# Patient Record
Sex: Male | Born: 1965 | Hispanic: No | Marital: Single | Smoking: Current every day smoker
Health system: Southern US, Community
[De-identification: ages and names within clinical notes are randomized; demographics above are authoritative.]

---

## 2017-03-21 ENCOUNTER — Encounter: Payer: Self-pay | Admitting: *Deleted

## 2017-03-21 ENCOUNTER — Emergency Department
Admission: EM | Admit: 2017-03-21 | Discharge: 2017-03-21 | Disposition: A | Discharge status: Home / Self Care | Payer: Self-pay | Attending: Emergency Medicine | Admitting: Emergency Medicine

## 2017-03-21 DIAGNOSIS — M7061 Trochanteric bursitis, right hip: Secondary | ICD-10-CM | POA: Insufficient documentation

## 2017-03-21 DIAGNOSIS — F17210 Nicotine dependence, cigarettes, uncomplicated: Secondary | ICD-10-CM | POA: Insufficient documentation

## 2017-03-21 DIAGNOSIS — Y9301 Activity, walking, marching and hiking: Secondary | ICD-10-CM | POA: Insufficient documentation

## 2017-03-21 MED ORDER — PREDNISONE 10 MG (21) PO TBPK: ORAL_TABLET | ORAL | 0 refills | Status: AC

## 2017-03-21 MED ORDER — DEXAMETHASONE SODIUM PHOSPHATE 10 MG/ML IJ SOLN
10.0000 mg | Freq: Once | INTRAMUSCULAR | Status: AC
  Administered 2017-03-21: 10 mg via INTRAMUSCULAR
  Filled 2017-03-21: qty 1

## 2017-03-21 NOTE — ED Notes (Signed)
See triage note  States he developed pain to right hip area that radiates down anterior aspect of right leg  Ambulates well   No injury

## 2017-03-21 NOTE — ED Triage Notes (Signed)
  Pt arrived to ED reporting right hip and leg pain that began all of a sudden 3 days ago. Pt denies injury, denies numbness and denies swelling. No discoloration. Pt able to ambulate with increased pain.

## 2017-03-21 NOTE — ED Provider Notes (Signed)
[facility] Emergency Department Provider Note   ____________________________________________   I have reviewed the triage vital signs and the nursing notes.   HISTORY  Chief Complaint Leg Pain    HPI Jeremy Morse is a 51 y.o. male presents with right lateral hip pain that developed suddenly 3 days ago without associated injury. Patient reports pain at rest and increases when he lies on his right side, when walking and outpatient along the greater trochanter area. Patient denies any injury or trauma to the right lower extremity in the past. Patient denies any right groin pain, lumbar back pain, symptoms consistent with lumbar radiculopathy or symptoms consistent with strain changes or leg giving way. Patient denies fever, chills, headache, vision changes, chest pain, chest tightness, shortness of breath, abdominal pain, nausea and vomiting.  History reviewed. No pertinent past medical history.  There are no active problems to display for this patient.   History reviewed. No pertinent surgical history.  Prior to Admission medications   Medication Sig Start Date End Date Taking? Authorizing Provider  predniSONE (STERAPRED UNI-PAK 21 TAB) 10 MG (21) TBPK tablet Take 6 tablets on day 1. Take 5 tablets on day 2. Take 4 tablets on day 3. Take 3 tablets on day 4. Take 2 tablets on day 5. Take 1 tablets on day 6. 03/21/17   Tarae Wooden M, PA-C    Allergies Patient has no known allergies.  History reviewed. No pertinent family history.  Social History Social History  Substance Use Topics  . Smoking status: Current Every Day Smoker    Packs/day: 1.00    Types: Cigarettes  . Smokeless tobacco: Never Used  . Alcohol use No    Review of Systems Constitutional: Negative for fever/chills Eyes: No visual changes. Cardiovascular: Denies chest pain. Respiratory: Denies cough Denies shortness of breath. Gastrointestinal: No abdominal pain.  No  nausea, vomiting, diarrhea. Musculoskeletal: Right lateral hip pain. Skin: Negative for rash. Neurological: Negative for headaches. Able to ambulate however with pain.  ____________________________________________   PHYSICAL EXAM:  VITAL SIGNS: ED Triage Vitals  Enc Vitals Group     BP 03/21/17 1106 (!) 155/99     Pulse Rate 03/21/17 1106 81     Resp 03/21/17 1106 16     Temp 03/21/17 1106 97.9 F (36.6 C)     Temp Source 03/21/17 1106 Oral     SpO2 03/21/17 1106 96 %     Weight 03/21/17 1110 131 lb 2.8 oz (59.5 kg)     Height --      Head Circumference --      Peak Flow --      Pain Score 03/21/17 1106 8     Pain Loc --      Pain Edu? --      Excl. in GC? --     Constitutional: Alert and oriented. Well appearing and in no acute distress.  Head: Normocephalic and atraumatic. Eyes: Conjunctivae are normal. PERRL. Normal extraocular movements. Cardiovascular: Normal rate, regular rhythm. Normal distal pulses. Respiratory: Normal respiratory effort.  Musculoskeletal: Right lateral hip pain with palpable tenderness along greater trochanter. Increased pain with walking and weight-bearing. Negative FABER and groin pain. Intact sensation, movement and strength of the right lower extremity. Otherwise, nontender with normal range of motion in all extremities. Neurologic: Normal speech and language.  Skin:  Skin is warm, dry and intact. No rash noted. Psychiatric: Mood and affect are normal.  ____________________________________________   LABS (all labs ordered are listed,  but only abnormal results are displayed)  Labs Reviewed - No data to display ____________________________________________  EKG None ____________________________________________  RADIOLOGY None ____________________________________________   PROCEDURES  Procedure(s) performed: No    Critical Care performed: no ____________________________________________   INITIAL IMPRESSION / ASSESSMENT AND  PLAN / ED COURSE  Pertinent labs & imaging results that were available during my care of the patient were reviewed by me and considered in my medical decision making (see chart for details).  Patient presents with right lateral hip pain that began 3 days ago without associated injury.. Patient reports increased pain with walking and weightbearing activities. Physical exam findings and history are consistent with greater trochanteric hip bursitis. Patient responded well to Decadron IM during course of care in emergency department. Patient will continue a prednisone taper for symptoms. Patient  informed of clinical course, understand medical decision-making process, and agree with plan.  Patient was advised to follow up with PCP as needed and was also advised to return to the emergency department for symptoms that change or worsen.      If controlled substance prescribed during this visit, 12 month history viewed on the [facility] prior to issuing an initial prescription for Schedule II or III opiod. ____________________________________________   FINAL CLINICAL IMPRESSION(S) / ED DIAGNOSES  Final diagnoses:  Trochanteric bursitis of right hip       NEW MEDICATIONS STARTED DURING THIS VISIT:  Discharge Medication List as of 03/21/2017 11:55 AM    START taking these medications   Details  predniSONE (STERAPRED UNI-PAK 21 TAB) 10 MG (21) TBPK tablet Take 6 tablets on day 1. Take 5 tablets on day 2. Take 4 tablets on day 3. Take 3 tablets on day 4. Take 2 tablets on day 5. Take 1 tablets on day 6., Print         Note:  This document was prepared using Dragon voice recognition software and may include unintentional dictation errors.    Clois ComberLittle, Yarieliz Wasser M, PA-C 03/21/17 1714    Jeanmarie PlantMcShane, James A, MD 03/22/17 1115

## 2017-05-17 ENCOUNTER — Emergency Department: Payer: Self-pay

## 2017-05-17 ENCOUNTER — Emergency Department
Admission: EM | Admit: 2017-05-17 | Discharge: 2017-05-17 | Disposition: A | Discharge status: Home / Self Care | Payer: Self-pay | Attending: Emergency Medicine | Admitting: Emergency Medicine

## 2017-05-17 DIAGNOSIS — R109 Unspecified abdominal pain: Secondary | ICD-10-CM

## 2017-05-17 DIAGNOSIS — R10816 Epigastric abdominal tenderness: Secondary | ICD-10-CM | POA: Insufficient documentation

## 2017-05-17 DIAGNOSIS — F17210 Nicotine dependence, cigarettes, uncomplicated: Secondary | ICD-10-CM | POA: Insufficient documentation

## 2017-05-17 DIAGNOSIS — R0789 Other chest pain: Secondary | ICD-10-CM | POA: Insufficient documentation

## 2017-05-17 DIAGNOSIS — M5489 Other dorsalgia: Secondary | ICD-10-CM | POA: Insufficient documentation

## 2017-05-17 LAB — BASIC METABOLIC PANEL
Anion gap: 8 (ref 5–15)
BUN: 21 mg/dL — AB (ref 6–20)
CALCIUM: 9.2 mg/dL (ref 8.9–10.3)
CO2: 26 mmol/L (ref 22–32)
CREATININE: 0.72 mg/dL (ref 0.61–1.24)
Chloride: 106 mmol/L (ref 101–111)
GFR calc Af Amer: >60 mL/min (ref >60)
GFR calc non Af Amer: >60 mL/min (ref >60)
GLUCOSE: 96 mg/dL (ref 65–99)
POTASSIUM: 3.8 mmol/L (ref 3.5–5.1)
Sodium: 140 mmol/L (ref 135–145)

## 2017-05-17 LAB — HEPATIC FUNCTION PANEL
ALK PHOS: 51 U/L (ref 38–126)
ALT: 20 U/L (ref 17–63)
AST: 24 U/L (ref 15–41)
Albumin: 4.5 g/dL (ref 3.5–5.0)
BILIRUBIN INDIRECT: 1 mg/dL — AB (ref 0.3–0.9)
Bilirubin, Direct: 0.2 mg/dL (ref 0.1–0.5)
Total Bilirubin: 1.2 mg/dL (ref 0.3–1.2)
Total Protein: 7.1 g/dL (ref 6.5–8.1)

## 2017-05-17 LAB — TROPONIN I: Troponin I: <0.03 ng/mL (ref <0.03)

## 2017-05-17 LAB — CBC
HCT: 41.6 % (ref 40.0–52.0)
Hemoglobin: 14.3 g/dL (ref 13.0–18.0)
MCH: 31.3 pg (ref 26.0–34.0)
MCHC: 34.4 g/dL (ref 32.0–36.0)
MCV: 91.1 fL (ref 80.0–100.0)
PLATELETS: 172 10*3/uL (ref 150–440)
RBC: 4.57 MIL/uL (ref 4.40–5.90)
RDW: 14 % (ref 11.5–14.5)
WBC: 6.3 10*3/uL (ref 3.8–10.6)

## 2017-05-17 LAB — LIPASE, BLOOD: LIPASE: 19 U/L (ref 11–51)

## 2017-05-17 MED ORDER — GI COCKTAIL ~~LOC~~
30.0000 mL | Freq: Once | ORAL | Status: AC
  Administered 2017-05-17: 30 mL via ORAL
  Filled 2017-05-17: qty 30

## 2017-05-17 NOTE — ED Provider Notes (Signed)
[facility] Emergency Department Provider Note  ____________________________________________   I have reviewed the triage vital signs and the nursing notes.   HISTORY  Chief Complaint Chest Pain and Back Pain    HPI Jeremy Morse is a 51 y.o. male Who is healthy, states that he does smoke but only a few cigarettes a day. Patient states that 3 days ago he began to have gradual onset muscle pain in his left upper back. It is worse when he moves his or changes position. No recollected injury. It's been there every minute of the day since then. It is not exertional. It does not radiate. He denies anumbness or weakness or incontinence of bowel or bladder. No injury, it is not pleuriticc. He denies any personal or family history of PE or DVT and he has had no shortness of breath.he also has epigastric abdominal pain starting this morning. Seems to radiate up towards his chest. It is nonexertional. It was a discomfort. It was not actually a pain. It is completely gone. He was concerned because his father had heart problems. He himself has had no exertional symptoms today either. The epigastric painthat radiates up towards hat is nonexertional, maybe worse hours this morning. Began at rest. Had no other radiation. However, when he took this and the fact that he was having some pain in his upper back into consideration, he elected to come for further evaluation. Patient states that the pain in his back and thesense of fullness in his abdomen/chest both began gradually. At no time did he have a tearing pain. Pain was not maximum intensity at onset. No leg swelling or recent travel.  The only thing that is bothering him right now is that if he moves the wrong way his muscles of his upper left back are uncomfortable.   History reviewed. No pertinent past medical history.  There are no active problems to display for this patient.   History reviewed. No pertinent  surgical history.  Prior to Admission medications   Medication Sig Start Date End Date Taking? Authorizing Provider  predniSONE (STERAPRED UNI-PAK 21 TAB) 10 MG (21) TBPK tablet Take 6 tablets on day 1. Take 5 tablets on day 2. Take 4 tablets on day 3. Take 3 tablets on day 4. Take 2 tablets on day 5. Take 1 tablets on day 6. 03/21/17   Little, Traci M, PA-C    Allergies Patient has no known allergies.  History reviewed. No pertinent family history.  Social History Social History  Substance Use Topics  . Smoking status: Current Every Day Smoker    Packs/day: 1.00    Types: Cigarettes  . Smokeless tobacco: Never Used  . Alcohol use Yes    Review of Systems Constitutional: No fever/chills Eyes: No visual changes. ENT: No sore throat. No stiff neck no neck pain Cardiovascular: see history of present illness Respiratory: Denies shortness of breath. Gastrointestinal:   no vomiting.  No diarrhea.  No constipation. Genitourinary: Negative for dysuria. Musculoskeletal: Negative lower extremity swelling Skin: Negative for rash. Neurological: Negative for severe headaches, focal weakness or numbness.   ____________________________________________   PHYSICAL EXAM:  VITAL SIGNS: ED Triage Vitals  Enc Vitals Group     BP 05/17/17 1246 (!) 160/106     Pulse Rate 05/17/17 1246 87     Resp 05/17/17 1246 16     Temp 05/17/17 1246 97.8 F (36.6 C)     Temp Source 05/17/17 1246 Oral     SpO2  05/17/17 1246 97 %     Weight 05/17/17 1247 136 lb 11 oz (62 kg)     Height 05/17/17 1247 5\' 8"  (1.727 m)     Head Circumference --      Peak Flow --      Pain Score 05/17/17 1246 3     Pain Loc --      Pain Edu? --      Excl. in GC? --     Constitutional: Alert and oriented. Well appearing and in no acute distress. Eyes: Conjunctivae are normal Head: Atraumatic HEENT: No congestion/rhinnorhea. Mucous membranes are moist.  Oropharynx non-erythematous Neck:   Nontender with no  meningismus, no masses, no stridor Cardiovascular: Normal rate, regular rhythm. Grossly normal heart sounds.  Good peripheral circulation. Respiratory: Normal respiratory effort.  No retractions. Lungs CTAB. Abdominal: Soft and minimalepigastric discomfort to deep palpation. No distention. No guarding no reboundthis reproduces the patient's discomfort. Back: there is minimal tenderness to palpation of the rhomboid muscles of the left upper back. This is just medial to the scapula. I touch this area patient states "yes that is the pain right there"inhaled corticosteroids very reproducible.There is some minimal muscle spasm. There is no erythema and there is no evidence of infection or skin lesion. There is no shingles noted. I can also reproduce the pain by having the patient pull up in the bed.  there is no midline tenderness there are no lesions noted. there is no CVA tenderness Musculoskeletal: No lower extremity tenderness, no upper extremity tenderness. No joint effusions, no DVT signs strong distal pulses no edema Neurologic:  Normal speech and language. No gross focal neurologic deficits are appreciated.  Skin:  Skin is warm, dry and intact. No rash noted. Psychiatric: Mood and affect are normal. Speech and behavior are normal.  ____________________________________________   LABS (all labs ordered are listed, but only abnormal results are displayed)  Labs Reviewed  BASIC METABOLIC PANEL - Abnormal; Notable for the following:       Result Value   BUN 21 (*)    All other components within normal limits  HEPATIC FUNCTION PANEL - Abnormal; Notable for the following:    Indirect Bilirubin 1.0 (*)    All other components within normal limits  CBC  TROPONIN I  LIPASE, BLOOD  TROPONIN I   ____________________________________________  EKG  I personally interpreted any EKGs ordered by me or triage Normal sinus rhythm at 78 bpm no acute ST elevation or depression, nonspecific ST changes,  normal axis. Flipped T waves anteriorly in V1 and V2 not known to be new, possibly normal variant ____________________________________________  RADIOLOGY  I reviewed any imaging ordered by me or triage that were performed during my shift and, if possible, patient and/or family made aware of any abnormal findings. ____________________________________________   PROCEDURES  Procedure(s) performed: None  Procedures  Critical Care performed: None  ____________________________________________   INITIAL IMPRESSION / ASSESSMENT AND PLAN / ED COURSE  Pertinent labs & imaging results that were available during my care of the patient were reviewed by me and considered in my medical decision making (see chart for details).  Patient here with 2 complaints. The first is cain in the upper back. 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 It does not appear to be exertional, low suspicion for ACS but we will send a troponin. Despite pain for 3 days, first troponin is negative. We will send serial cardiac enzymes as a precaution. Low suspicion  for dissection given history. Gradual onset, not worst pain of life, reproducible, etc. Patient does not have any significant risk factors for dissection. Blood pressure is mildly elevated but he is quite anxious about the possibility that's having a heart attack and we will recheck that.   The next complaint is epigastric abdominal discomfort. Over function tests were added on by me and they are normal, bilirubin is borderline high however we will obtain ultrasound to rule out gallbladder disease I gave him a GI cocktail which seemed to make him feel better. This will not rule out cardiac disease obviously but certainly is areassuring prognostic indicator. Chest x-ray is reassuring. Abdomen is otherwise soft and nontender. Obtain an ultrasound of his right upper quadrant to make sure this is not gallbladder disease. Very low suspicion for ACS PE  or dissection or AAA for this minimal reproducible discomfort.    ____________________________________________   FINAL CLINICAL IMPRESSION(S) / ED DIAGNOSES  Final diagnoses:  Abdominal pain      This chart was dictated using voice recognition software.  Despite best efforts to proofread,  errors can occur which can change meaning.      Jeanmarie Plant, MD 05/17/17 [phone]

## 2017-05-17 NOTE — ED Triage Notes (Signed)
Pt states week ago started with upper back pain and today started with central CP, non radiating. Denies arm or jaw pain. Does not appear sweaty. Denies N&V&, dizziness. Alert, oriented, ambulatory. States 5 years ago father had open heart surgery so just worried.

## 2017-05-17 NOTE — Discharge Instructions (Signed)
Tests are very reassuring here., however, we cannot definitively tell you that there is nothing wrong with your heart without close follow up with a cardiologist. Please call the number provided and see them in the next 2 days. Return to the emergency department for increased pain, chest pain, shortness of breath, numbness, weakness, or if you feel worse in any way.

## 2017-05-20 ENCOUNTER — Other Ambulatory Visit: Payer: Self-pay

## 2017-05-20 ENCOUNTER — Encounter: Payer: Self-pay | Admitting: Emergency Medicine

## 2017-05-20 ENCOUNTER — Emergency Department: Payer: Self-pay

## 2017-05-20 ENCOUNTER — Emergency Department
Admission: EM | Admit: 2017-05-20 | Discharge: 2017-05-20 | Disposition: A | Discharge status: Home / Self Care | Payer: Self-pay | Attending: Emergency Medicine | Admitting: Emergency Medicine

## 2017-05-20 DIAGNOSIS — F17210 Nicotine dependence, cigarettes, uncomplicated: Secondary | ICD-10-CM | POA: Insufficient documentation

## 2017-05-20 DIAGNOSIS — R0789 Other chest pain: Secondary | ICD-10-CM | POA: Insufficient documentation

## 2017-05-20 DIAGNOSIS — M549 Dorsalgia, unspecified: Secondary | ICD-10-CM | POA: Insufficient documentation

## 2017-05-20 LAB — BASIC METABOLIC PANEL
Anion gap: 9 (ref 5–15)
BUN: 16 mg/dL (ref 6–20)
CO2: 27 mmol/L (ref 22–32)
CREATININE: 0.78 mg/dL (ref 0.61–1.24)
Calcium: 9.3 mg/dL (ref 8.9–10.3)
Chloride: 103 mmol/L (ref 101–111)
GFR calc non Af Amer: >60 mL/min (ref >60)
Glucose, Bld: 102 mg/dL — ABNORMAL HIGH (ref 65–99)
Potassium: 3.4 mmol/L — ABNORMAL LOW (ref 3.5–5.1)
SODIUM: 139 mmol/L (ref 135–145)

## 2017-05-20 LAB — CBC
HCT: 40.8 % (ref 40.0–52.0)
Hemoglobin: 13.9 g/dL (ref 13.0–18.0)
MCH: 31.8 pg (ref 26.0–34.0)
MCHC: 34 g/dL (ref 32.0–36.0)
MCV: 93.5 fL (ref 80.0–100.0)
PLATELETS: 157 10*3/uL (ref 150–440)
RBC: 4.36 MIL/uL — AB (ref 4.40–5.90)
RDW: 14.2 % (ref 11.5–14.5)
WBC: 6.8 10*3/uL (ref 3.8–10.6)

## 2017-05-20 LAB — TROPONIN I

## 2017-05-20 MED ORDER — ASPIRIN 81 MG PO CHEW
324.0000 mg | CHEWABLE_TABLET | Freq: Once | ORAL | Status: AC
  Administered 2017-05-20: 324 mg via ORAL
  Filled 2017-05-20: qty 4

## 2017-05-20 MED ORDER — IOPAMIDOL (ISOVUE-370) INJECTION 76%
100.0000 mL | Freq: Once | INTRAVENOUS | Status: AC | PRN
  Administered 2017-05-20: 100 mL via INTRAVENOUS
  Filled 2017-05-20: qty 100

## 2017-05-20 NOTE — ED Provider Notes (Signed)
[facility] Emergency Department Provider Note  ____________________________________________   First MD Initiated Contact with Patient 05/20/17 1931     (approximate)  I have reviewed the triage vital signs and the nursing notes.   HISTORY  Chief Complaint Chest Pain    HPI Jeremy Morse is a 51 y.o. male with a history of tobacco use and a family history of ACS who presents for evaluation of chest pain.  He was seen in this emergency department 4 days ago for chest pain and had a workup that included both ACS rule out and right upper quadrant ultrasound, his workup was generally reassuring and did not identify a specific cause of his pain.  He has not had the opportunity to follow up with a cardiologist or other outpatient provider.  He came back to the emergency department today because while he was at work he had acute onset of pain in his chest and the middle of his back.  The pain seems to be radiating through although  is not certain in which direction.  Nothing in particular makes the patient's symptoms better nor worse.    He describes the pain as severe at its worst.  He currently is not having chest pain but he has persistent pain in the middle of his back.  He feels it is stress related. He denies fever/chills nausea, vomiting, abdominal pain.   History reviewed. No pertinent past medical history.  There are no active problems to display for this patient.   History reviewed. No pertinent surgical history.  Prior to Admission medications   Medication Sig Start Date End Date Taking? Authorizing Provider  predniSONE (STERAPRED UNI-PAK 21 TAB) 10 MG (21) TBPK tablet Take 6 tablets on day 1. Take 5 tablets on day 2. Take 4 tablets on day 3. Take 3 tablets on day 4. Take 2 tablets on day 5. Take 1 tablets on day 6. 03/21/17   Little, Traci M, PA-C    Allergies Patient has no known allergies.  History reviewed. No pertinent family  history.  Social History Social History  Substance Use Topics  . Smoking status: Current Every Day Smoker    Packs/day: 1.00    Types: Cigarettes  . Smokeless tobacco: Never Used  . Alcohol use Yes    Review of Systems Constitutional: No fever/chills Eyes: No visual changes. ENT: No sore throat. Cardiovascular: episodic chest and back pain Respiratory: Denies shortness of breath. Gastrointestinal: No abdominal pain.  No nausea, no vomiting.  No diarrhea.  No constipation. Genitourinary: Negative for dysuria. Musculoskeletal: Negative for neck pain.  episodic chest and back pain Integumentary: Negative for rash. Neurological: Negative for headaches, focal weakness or numbness.   ____________________________________________   PHYSICAL EXAM:  VITAL SIGNS: ED Triage Vitals  Enc Vitals Group     BP 05/20/17 1822 (!) 145/80     Pulse Rate 05/20/17 1822 72     Resp 05/20/17 1822 20     Temp 05/20/17 1822 97.6 F (36.4 C)     Temp Source 05/20/17 1822 Oral     SpO2 05/20/17 1822 96 %     Weight 05/20/17 1822 61.7 kg (136 lb)     Height 05/20/17 1822 1.727 m (5\' 8" )     Head Circumference --      Peak Flow --      Pain Score 05/20/17 1821 5     Pain Loc --      Pain Edu? --  Excl. in GC? --     Constitutional: Alert and oriented. Well appearing and in no acute distress. Eyes: Conjunctivae are normal.  Head: Atraumatic. Nose: No congestion/rhinnorhea. Mouth/Throat: Mucous membranes are moist. Neck: No stridor.  No meningeal signs.   Cardiovascular: Normal rate, regular rhythm. Good peripheral circulation. Grossly normal heart sounds. Respiratory: Normal respiratory effort.  No retractions. Lungs CTAB. Gastrointestinal: Soft and nontender. No distention.  Musculoskeletal: No lower extremity tenderness nor edema. No gross deformities of extremities. Neurologic:  Normal speech and language. No gross focal neurologic deficits are appreciated.  Skin:  Skin is warm,  dry and intact. No rash noted. Psychiatric: Mood and affect are normal. Speech and behavior are normal.  ____________________________________________   LABS (all labs ordered are listed, but only abnormal results are displayed)  Labs Reviewed  BASIC METABOLIC PANEL - Abnormal; Notable for the following:       Result Value   Potassium 3.4 (*)    Glucose, Bld 102 (*)    All other components within normal limits  CBC - Abnormal; Notable for the following:    RBC 4.36 (*)    All other components within normal limits  TROPONIN I   ____________________________________________  EKG  ED ECG REPORT I, Amee Boothe, the attending physician, personally viewed and interpreted this ECG.  Date: 05/20/2017 EKG Time: 18:15 Rate: 71 Rhythm: normal sinus rhythm QRS Axis: normal Intervals: LVH, otherwise unremarkable ST/T Wave abnormalities: normal Narrative Interpretation: no evidence of acute ischemia  ____________________________________________  RADIOLOGY   Dg Chest 2 View  Result Date: 05/20/2017 CLINICAL DATA:  Sudden onset chest pain a few hours ago. EXAM: CHEST  2 VIEW COMPARISON:  05/17/2017 FINDINGS: The heart size and mediastinal contours are within normal limits. Lungs are hyperinflated without pneumonic consolidation or pneumothorax. No effusion or CHF. The visualized skeletal structures are unremarkable. IMPRESSION: No active cardiopulmonary disease. Mild hyperinflation of the lungs. Electronically Signed   By: Tollie Eth M.D.   On: 05/20/2017 18:48   Ct Angio Chest Aorta W And/or Wo Contrast  Result Date: 05/20/2017 CLINICAL DATA:  Chest pain. EXAM: CT ANGIOGRAPHY CHEST WITH CONTRAST TECHNIQUE: Multidetector CT imaging of the chest was performed using the standard protocol during bolus administration of intravenous contrast. Multiplanar CT image reconstructions and MIPs were obtained to evaluate the vascular anatomy. CONTRAST:  100 mL of Isovue 370 intravenously.  COMPARISON:  Radiographs of same day. FINDINGS: Cardiovascular: Preferential opacification of the thoracic aorta. No evidence of thoracic aortic aneurysm or dissection. Normal heart size. No pericardial effusion. Mediastinum/Nodes: No enlarged mediastinal, hilar, or axillary lymph nodes. Thyroid gland, trachea, and esophagus demonstrate no significant findings. Lungs/Pleura: Lungs are clear. No pleural effusion or pneumothorax. Upper Abdomen: No acute abnormality. Musculoskeletal: No chest wall abnormality. No acute or significant osseous findings. Review of the MIP images confirms the above findings. IMPRESSION: No evidence of thoracic aortic dissection or aneurysm. No definite abnormality seen in the chest. Electronically Signed   By: Lupita Raider, M.D.   On: 05/20/2017 21:22    ____________________________________________   PROCEDURES  Critical Care performed: No   Procedure(s) performed:   Procedures   ____________________________________________   INITIAL IMPRESSION / ASSESSMENT AND PLAN / ED COURSE  Pertinent labs & imaging results that were available during my care of the patient were reviewed by me and considered in my medical decision making (see chart for details).  The patient was recently seen in the emergency department and had an extensive and reassuring workup.  However  he re-presents with the same symptoms.  I reviewed the documentation by Dr. Alphonzo Lemmings and agree with his assessment that aortic pathology is unlikely.  However given that he has consistently negative troponins, evidence of LVH on his EKG, hypertension, and is back in the emergency department with the same symptoms include chest and back pain, I explained to the patient that I would evaluate him today with CT angiography of the chest.  I explained further that if his workup is again reassuring today with no evidence of acute or emergent medical condition, it will be very important that he follow up with a  cardiologist for further outpatient workup and evaluation.  He understands and agrees with the plan.   Clinical Course as of May 20 2152  Thu May 20, 2017  2131 CT Angio Chest Aorta W and/or Wo Contrast [CF]  2140 I updated the patient with his normal CTA results.  Went over return precautions and follow up recommendations.  Advised daily low dose ASA, gave full dose ASA tonight.  Also advised use of daily PPI, since this may be GI in nature.  Patient understands/agrees with the plan  [CF]    Clinical Course User Index [CF] Loleta Rose, MD    ____________________________________________  FINAL CLINICAL IMPRESSION(S) / ED DIAGNOSES  Final diagnoses:  Atypical chest pain  Back pain, unspecified back location, unspecified back pain laterality, unspecified chronicity     MEDICATIONS GIVEN DURING THIS VISIT:  Medications  aspirin chewable tablet 324 mg (not administered)  iopamidol (ISOVUE-370) 76 % injection 100 mL (100 mLs Intravenous Contrast Given 05/20/17 2059)     NEW OUTPATIENT MEDICATIONS STARTED DURING THIS VISIT:  New Prescriptions   No medications on file    Modified Medications   No medications on file    Discontinued Medications   No medications on file     Note:  This document was prepared using Dragon voice recognition software and may include unintentional dictation errors.    Loleta Rose, MD 05/20/17 2153

## 2017-05-20 NOTE — ED Triage Notes (Addendum)
Pt reports chest pain sudden onset in the central chest a few hours ago today. Pt states he was not doing anything when the pain started. Pt states its feels like pressure 5/10. Pt here Monday for the same. Pt states pain radiates to his back as well which was the same Monday.  Pt states father had a MI 25 years ago with open heart surgery.

## 2017-05-20 NOTE — ED Notes (Signed)

## 2017-05-20 NOTE — Discharge Instructions (Signed)
You have been seen in the Emergency Department (ED) today for chest pain.  As we have discussed today?s test results are normal, but you may require further testing.  Please follow up with the recommended doctor as instructed above in these documents regarding today?s emergent visit and your recent symptoms to discuss further management.  Continue to take your regular medications. If you are not doing so already, please also take a daily baby aspirin (81 mg), at least until you follow up with your doctor.  We also suggest you take a daily antacid medication such as Prilosec OTC (over the counter).  It may help with your symptoms since they could be GI in nature.  Please remember it may take several days for them to begin to work.  Return to the Emergency Department (ED) if you experience any further chest pain/pressure/tightness, difficulty breathing, or sudden sweating, or other symptoms that concern you.

## 2018-07-31 IMAGING — US US ABDOMEN LIMITED
1 series · 14 of 25 positions shown · non-contrast
Comparison: None in PACs

CLINICAL DATA: Abdominal pain

EXAM:
ULTRASOUND ABDOMEN LIMITED RIGHT UPPER QUADRANT

[Series 1: us abdomen limited · 0.19mm/px · 14 of 48 slices shown]
[im 1/48]
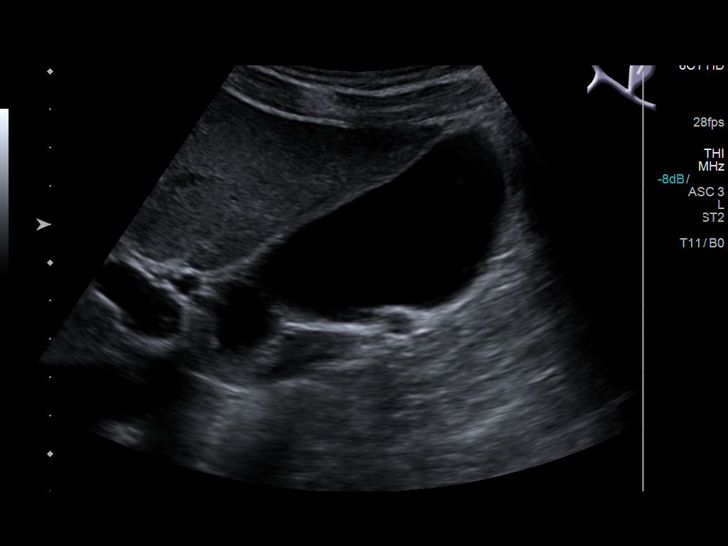
[im 4/48]
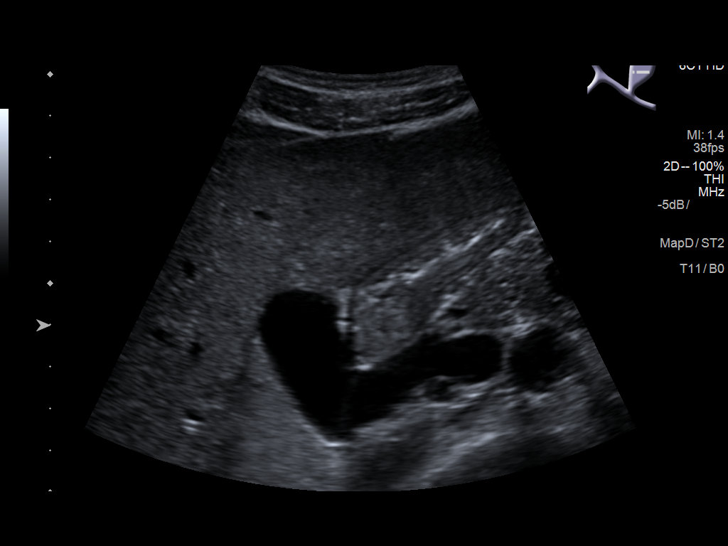
[im 8/48]
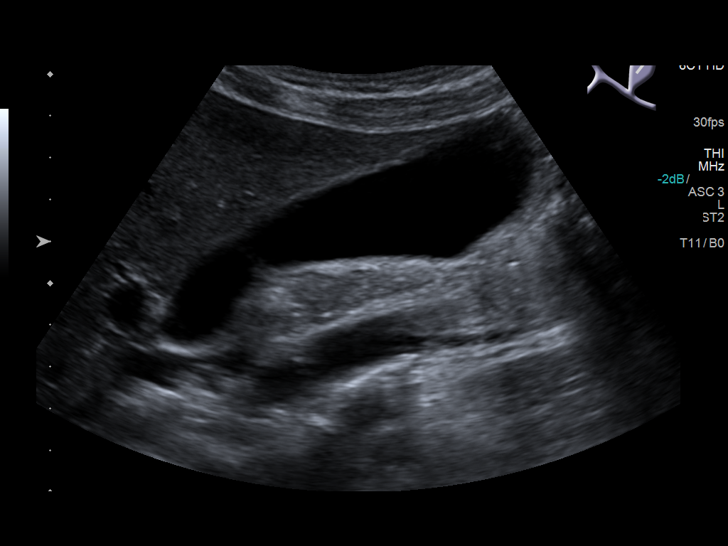
[im 12/48]
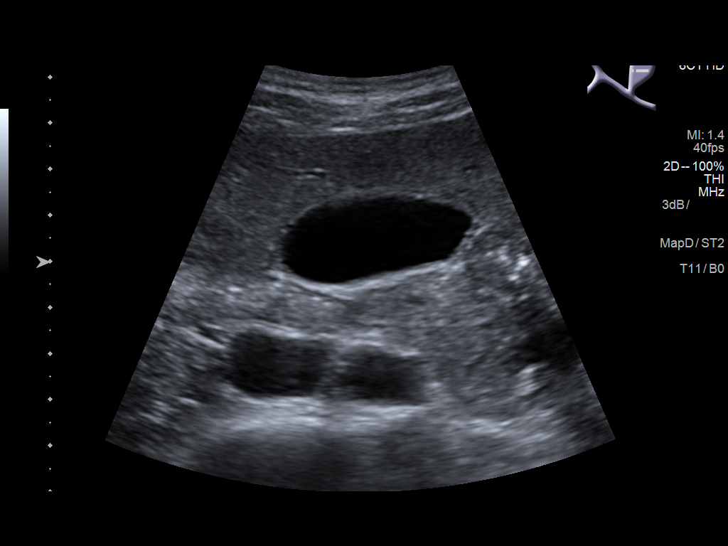
[im 16/48]
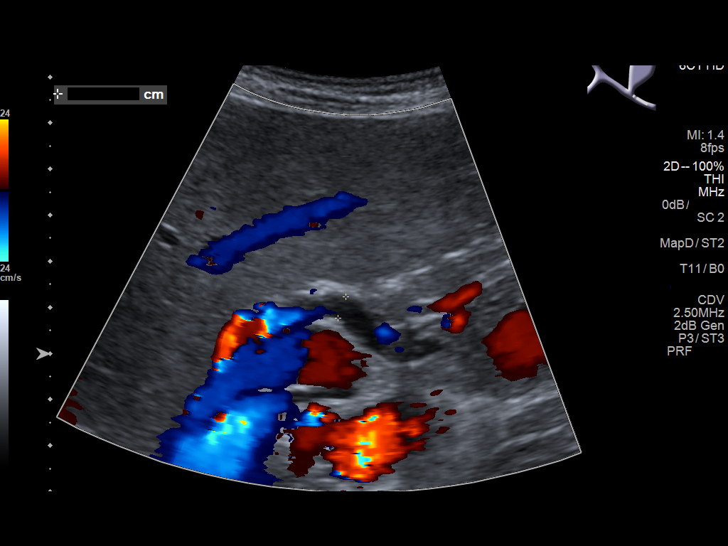
[im 18/48]
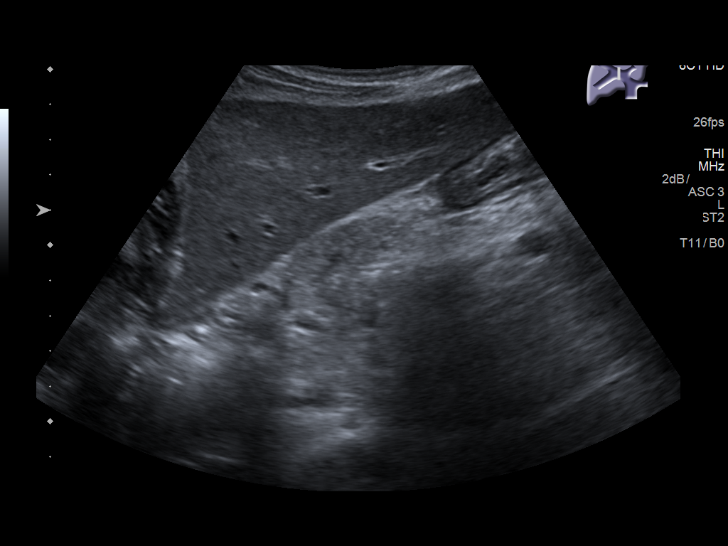
[im 22/48]
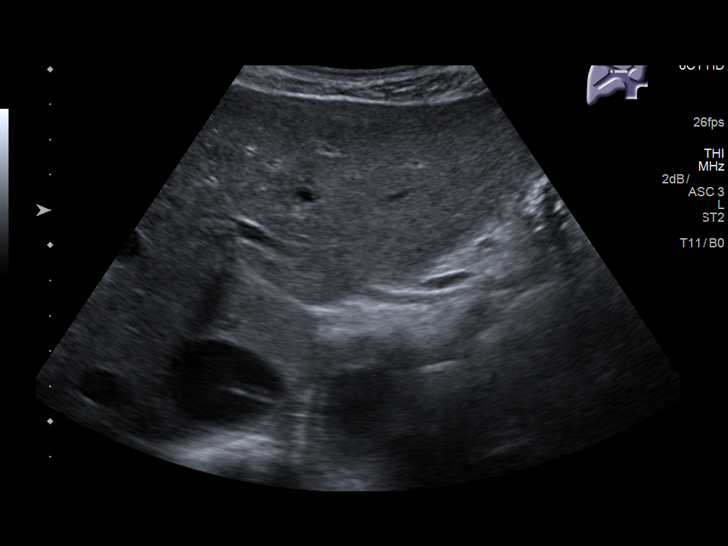
[im 26/48]
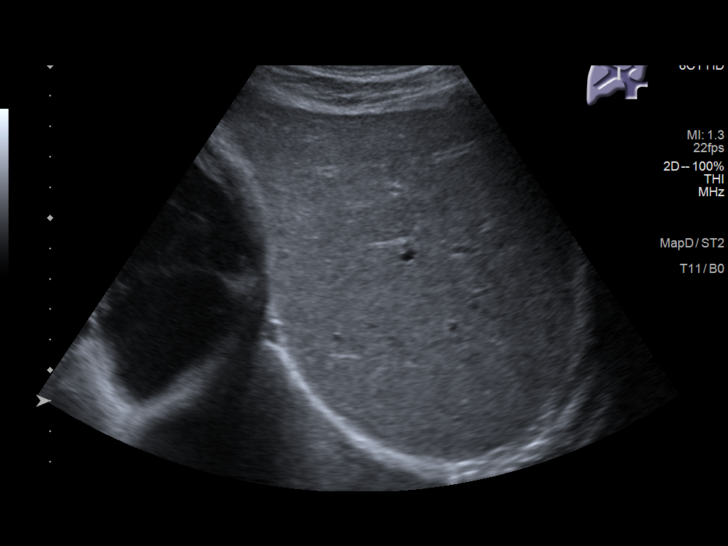
[im 30/48]
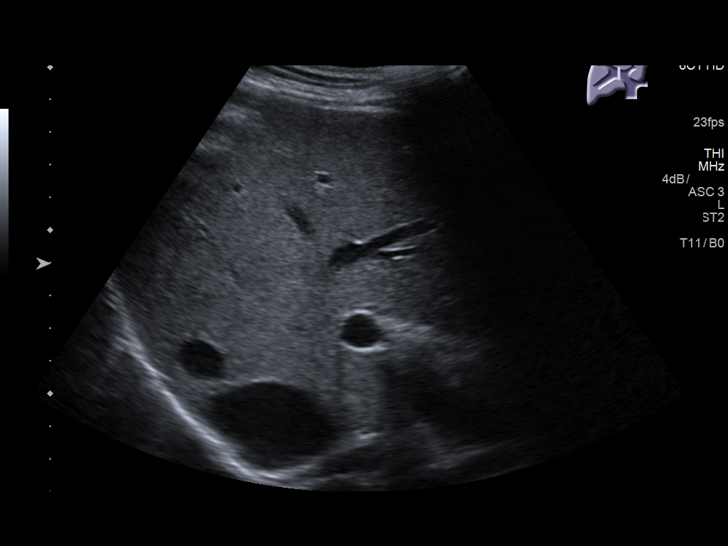
[im 32/48]
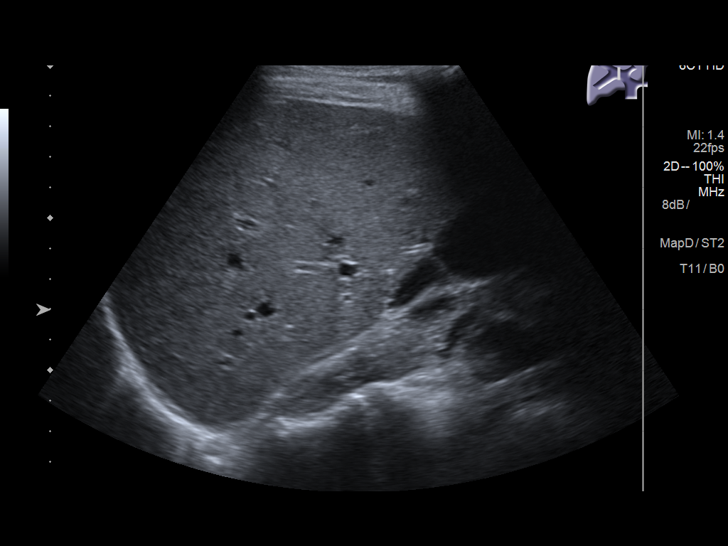
[im 36/48]
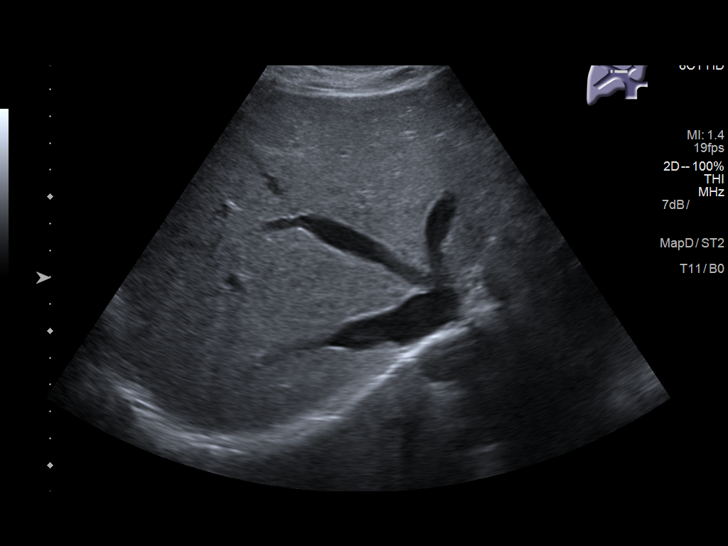
[im 40/48]
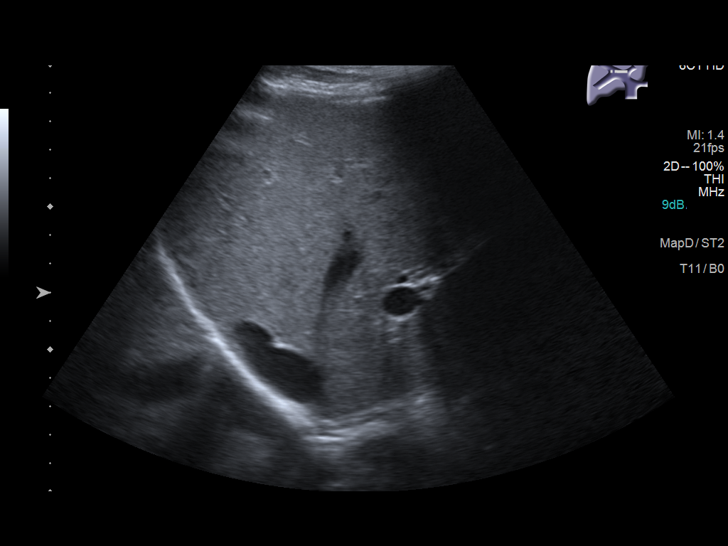
[im 44/48]
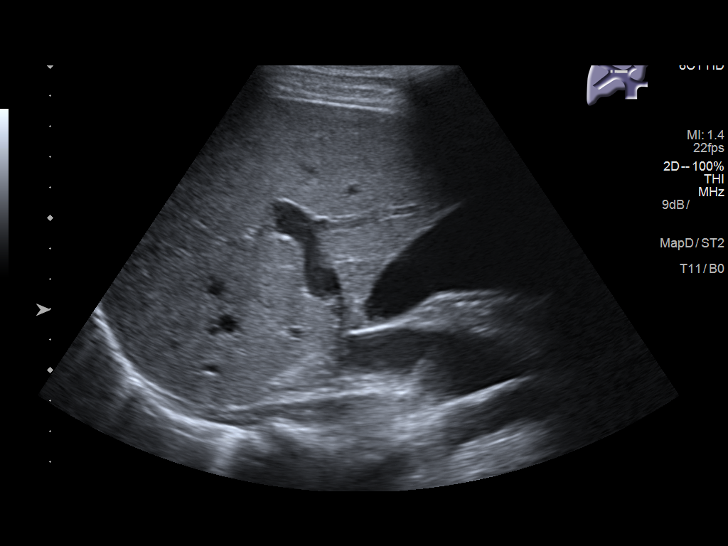
[im 48/48]
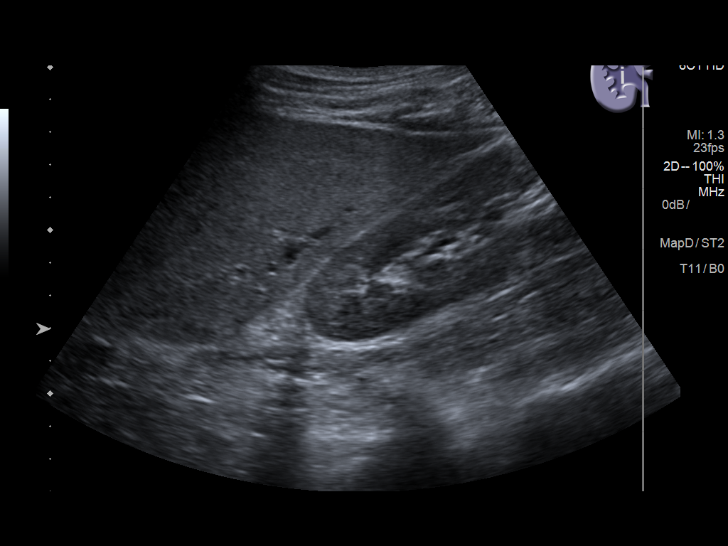

[14 of 25 positions shown; findings below may reference images not displayed]

FINDINGS: Gallbladder:

The gallbladder is adequately distended. No stones or sludge are
observed. There is no gallbladder wall thickening, pericholecystic
fluid, or positive sonographic Murphy's sign.

Common bile duct:

Diameter: 4.9 mm

Liver:

The hepatic echotexture is heterogeneous. There is no focal mass or
ductal dilation. The surface contour is smooth. Portal vein is
patent on color Doppler imaging with normal direction of blood flow
towards the liver.
IMPRESSION: Mildly heterogeneous hepatic echotexture likely reflects fatty
infiltrative change. No discrete mass or surface contour
irregularity.

Normal appearance of the gallbladder and common bile duct. If the
patient's symptoms suggest chronic gallbladder dysfunction, nuclear
medicine hepatobiliary scanning with gallbladder ejection fraction
determination may be useful.

## 2019-01-24 IMAGING — CT CT ANGIO CHEST
3 of 7 series · 19 of 46 positions shown · IV contrast (APPLIED)
Comparison: Radiographs of same day.

CLINICAL DATA: Chest pain.

EXAM:
CT ANGIOGRAPHY CHEST WITH CONTRAST
TECHNIQUE: Multidetector CT imaging of the chest was performed using the
standard protocol during bolus administration of intravenous
contrast. Multiplanar CT image reconstructions and MIPs were
obtained to evaluate the vascular anatomy.
CONTRAST:  100 mL of Isovue 370 intravenously.

[Series 6: axial arterial · axial · arterial · 0.71mm/px · z∈[-625,-316]mm · 13 of 121 slices shown]
[im 9/121  lung]
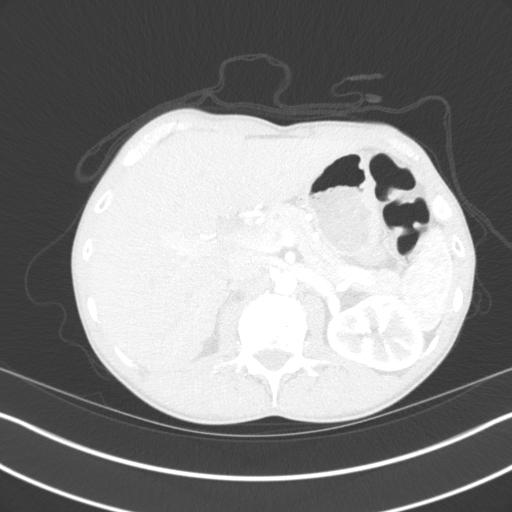
[im 18/121  soft-tissue]
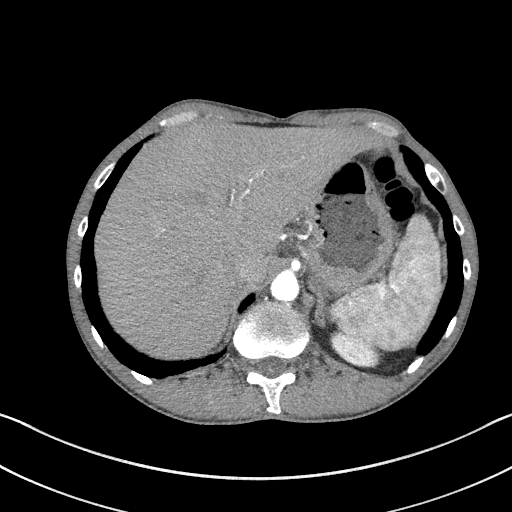
[im 26/121  lung]
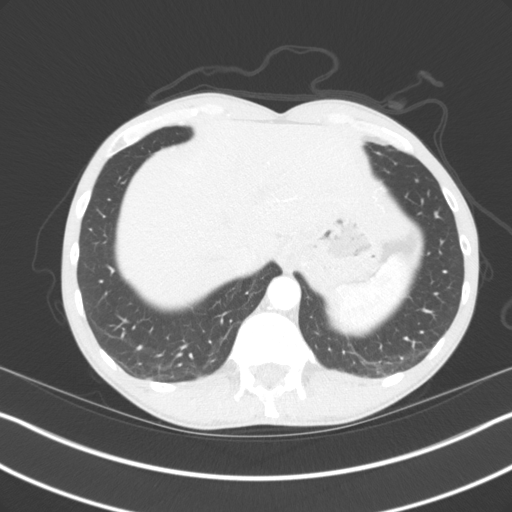
[im 35/121  soft-tissue]
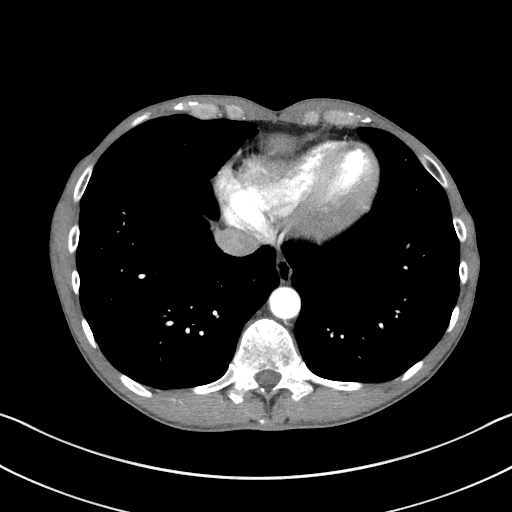
[im 43/121  lung]
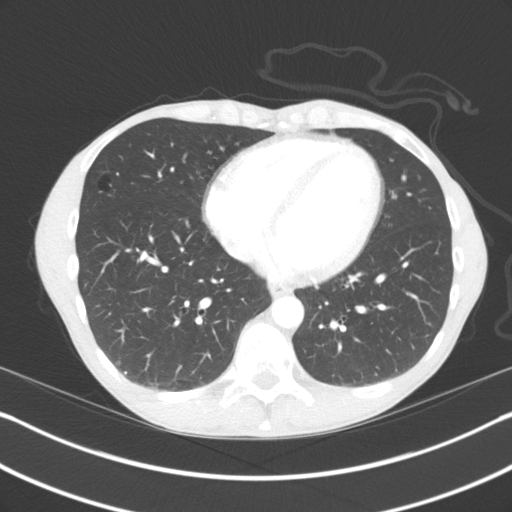
[im 52/121  soft-tissue]
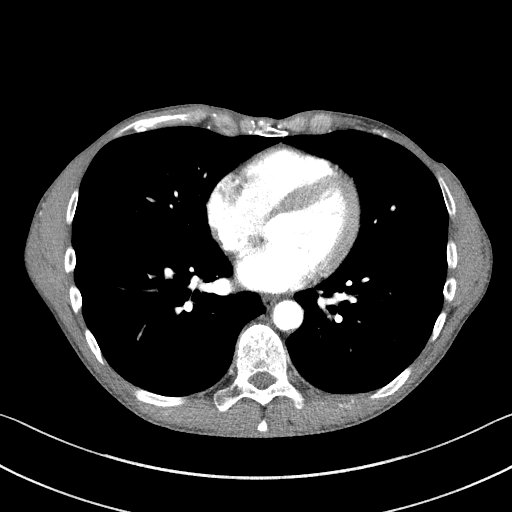
[im 61/121  lung]
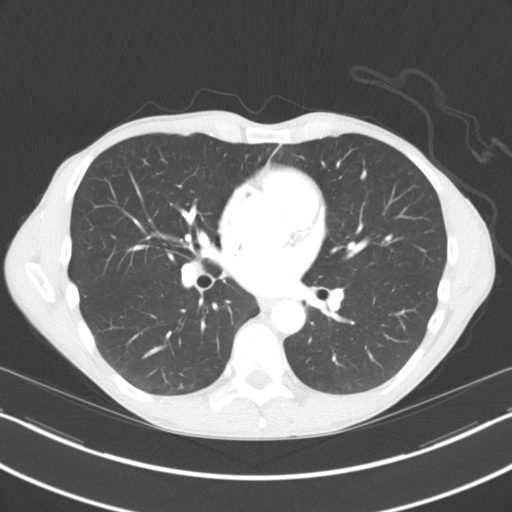
[im 69/121  soft-tissue]
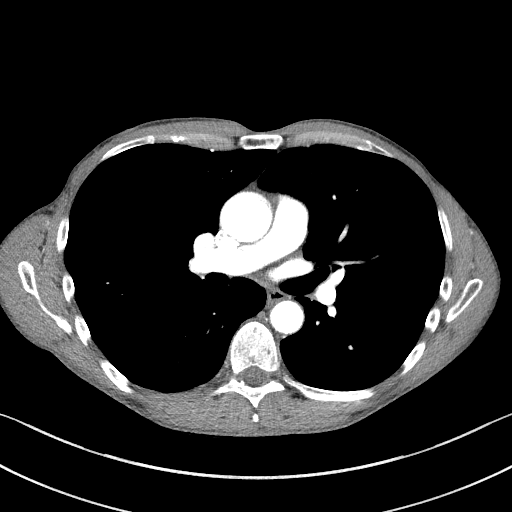
[im 78/121  lung]
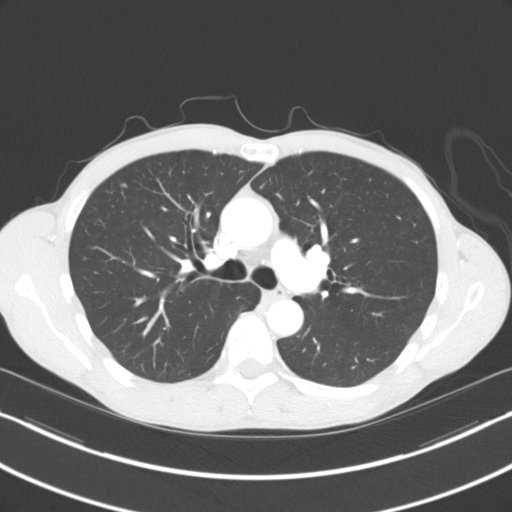
[im 86/121  soft-tissue]
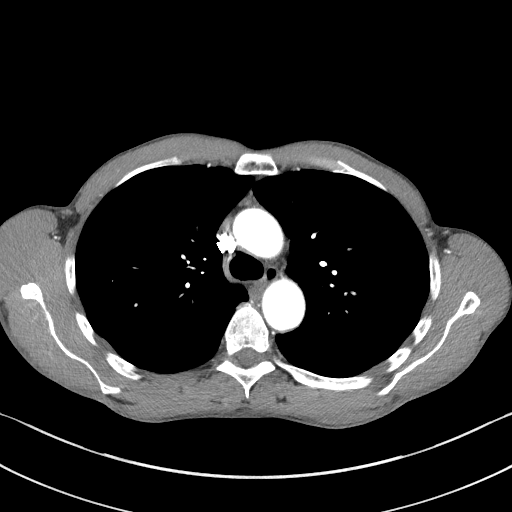
[im 95/121  lung]
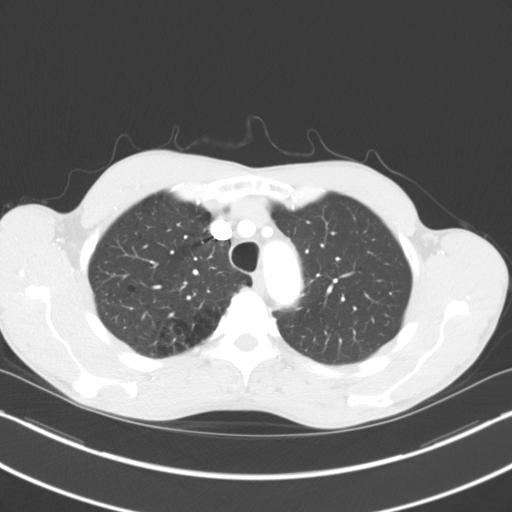
[im 103/121  soft-tissue]
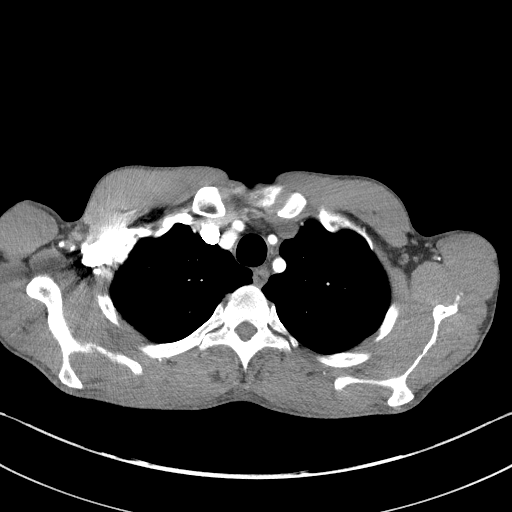
[im 112/121  lung]
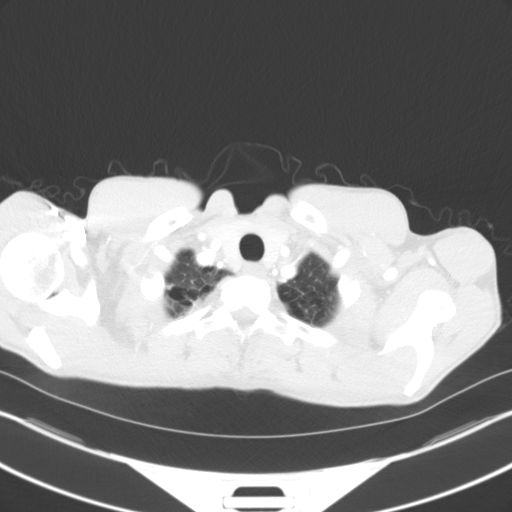

[Series 7: lung · axial · 0.71mm/px · z∈[-604,-514]mm · 3 of 73 slices shown]
[im 10/73  soft-tissue]
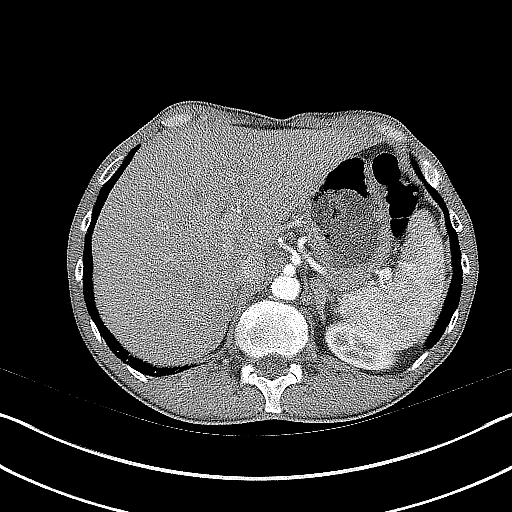
[im 19/73  soft-tissue]
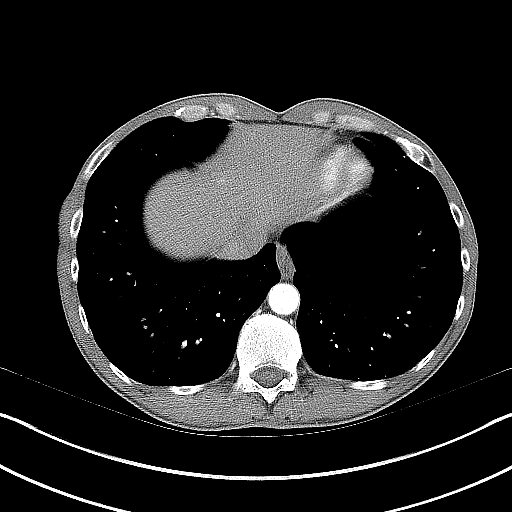
[im 28/73  soft-tissue]
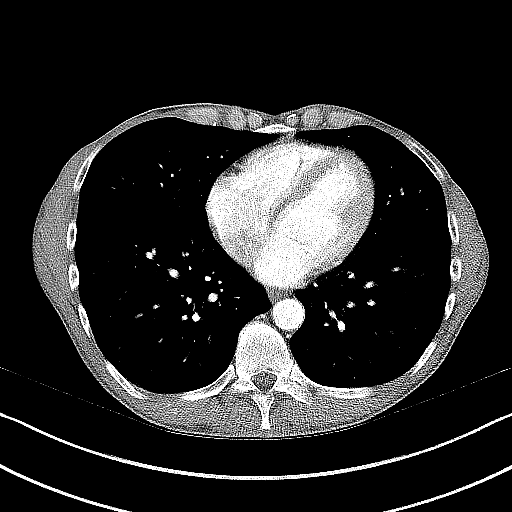

[Series 8: coronals · coronal · 0.73mm/px · 3 of 120 slices shown]
[im 30/120  soft-tissue]
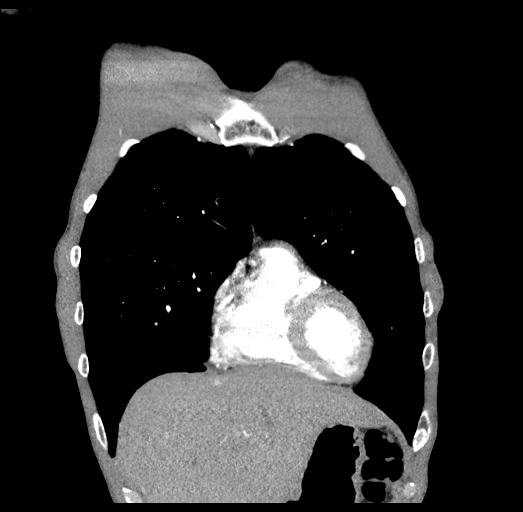
[im 60/120  soft-tissue]
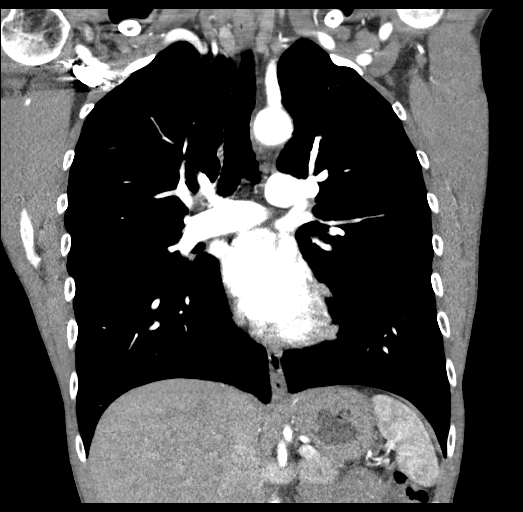
[im 90/120  soft-tissue]
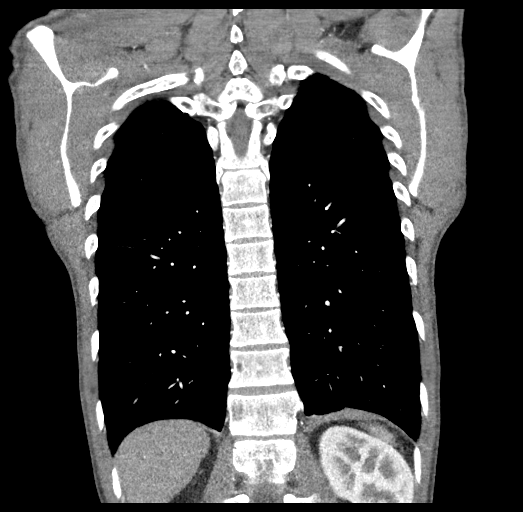

[19 of 46 positions shown; findings below may reference images not displayed]

FINDINGS: Cardiovascular: Preferential opacification of the thoracic aorta. No
evidence of thoracic aortic aneurysm or dissection. Normal heart
size. No pericardial effusion.

Mediastinum/Nodes: No enlarged mediastinal, hilar, or axillary lymph
nodes. Thyroid gland, trachea, and esophagus demonstrate no
significant findings.

Lungs/Pleura: Lungs are clear. No pleural effusion or pneumothorax.

Upper Abdomen: No acute abnormality.

Musculoskeletal: No chest wall abnormality. No acute or significant
osseous findings.

Review of the MIP images confirms the above findings.
IMPRESSION: No evidence of thoracic aortic dissection or aneurysm. No definite
abnormality seen in the chest.
